# Patient Record
Sex: Male | Born: 1990 | Race: White | Hispanic: No | Marital: Married | Smoking: Never smoker
Health system: Southern US, Community
[De-identification: ages and names within clinical notes are randomized; demographics above are authoritative.]

---

## 2010-08-01 HISTORY — PX: TONSILLECTOMY: SUR1361

## 2016-03-21 DIAGNOSIS — Z20828 Contact with and (suspected) exposure to other viral communicable diseases: Secondary | ICD-10-CM | POA: Diagnosis not present

## 2016-05-20 ENCOUNTER — Other Ambulatory Visit: Payer: Self-pay | Admitting: Family Medicine

## 2016-05-20 DIAGNOSIS — Z0000 Encounter for general adult medical examination without abnormal findings: Secondary | ICD-10-CM | POA: Diagnosis not present

## 2016-05-20 DIAGNOSIS — Z13220 Encounter for screening for lipoid disorders: Secondary | ICD-10-CM | POA: Diagnosis not present

## 2016-05-20 DIAGNOSIS — N5089 Other specified disorders of the male genital organs: Secondary | ICD-10-CM

## 2016-05-30 ENCOUNTER — Ambulatory Visit
Admission: RE | Admit: 2016-05-30 | Discharge: 2016-05-30 | Disposition: A | Discharge status: Home / Self Care | Payer: 59 | Source: Ambulatory Visit | Attending: Family Medicine | Admitting: Family Medicine

## 2016-05-30 DIAGNOSIS — N5089 Other specified disorders of the male genital organs: Secondary | ICD-10-CM

## 2016-07-08 ENCOUNTER — Other Ambulatory Visit: Payer: 59

## 2016-11-23 ENCOUNTER — Other Ambulatory Visit: Payer: Self-pay | Admitting: Family Medicine

## 2016-11-23 DIAGNOSIS — N5089 Other specified disorders of the male genital organs: Secondary | ICD-10-CM

## 2016-11-29 ENCOUNTER — Ambulatory Visit
Admission: RE | Admit: 2016-11-29 | Discharge: 2016-11-29 | Disposition: A | Discharge status: Home / Self Care | Payer: 59 | Source: Ambulatory Visit | Attending: Family Medicine | Admitting: Family Medicine

## 2016-11-29 DIAGNOSIS — N503 Cyst of epididymis: Secondary | ICD-10-CM | POA: Diagnosis not present

## 2016-11-29 DIAGNOSIS — N5089 Other specified disorders of the male genital organs: Secondary | ICD-10-CM

## 2016-11-30 DIAGNOSIS — Z23 Encounter for immunization: Secondary | ICD-10-CM | POA: Diagnosis not present

## 2017-05-23 DIAGNOSIS — R7301 Impaired fasting glucose: Secondary | ICD-10-CM | POA: Diagnosis not present

## 2017-05-23 DIAGNOSIS — Z13220 Encounter for screening for lipoid disorders: Secondary | ICD-10-CM | POA: Diagnosis not present

## 2017-05-23 DIAGNOSIS — Z0000 Encounter for general adult medical examination without abnormal findings: Secondary | ICD-10-CM | POA: Diagnosis not present

## 2017-11-28 DIAGNOSIS — J208 Acute bronchitis due to other specified organisms: Secondary | ICD-10-CM | POA: Diagnosis not present

## 2017-12-13 DIAGNOSIS — Z23 Encounter for immunization: Secondary | ICD-10-CM | POA: Diagnosis not present

## 2018-03-19 DIAGNOSIS — R0681 Apnea, not elsewhere classified: Secondary | ICD-10-CM | POA: Diagnosis not present

## 2018-12-06 IMAGING — US US SCROTUM W/ DOPPLER COMPLETE
1 series · 14 of 25 positions shown · non-contrast
Comparison: None.

CLINICAL DATA: Right-sided testicular lump

EXAM:
SCROTAL ULTRASOUND
DOPPLER ULTRASOUND OF THE TESTICLES
TECHNIQUE: Complete ultrasound examination of the testicles, epididymis, and
other scrotal structures was performed. Color and spectral Doppler
ultrasound were also utilized to evaluate blood flow to the
testicles.

[Series 1: us scrotum w/ doppler complete · 0.05mm/px · 14 of 53 slices shown]
[im 1/53]
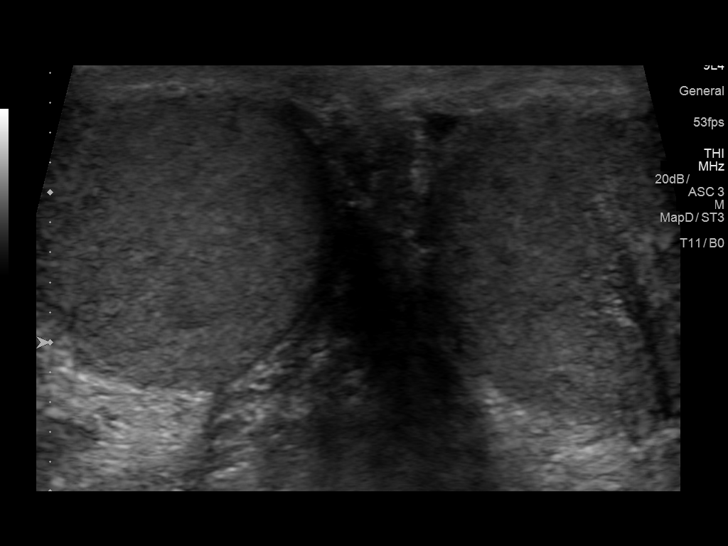
[im 5/53]
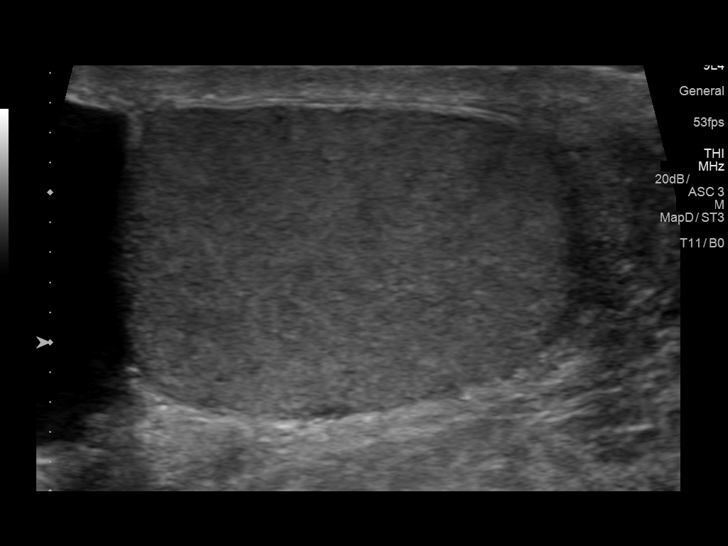
[im 9/53]
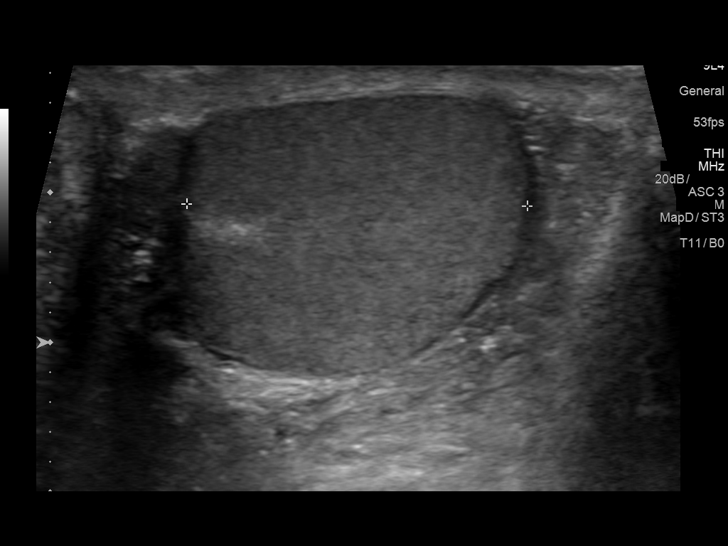
[im 14/53]
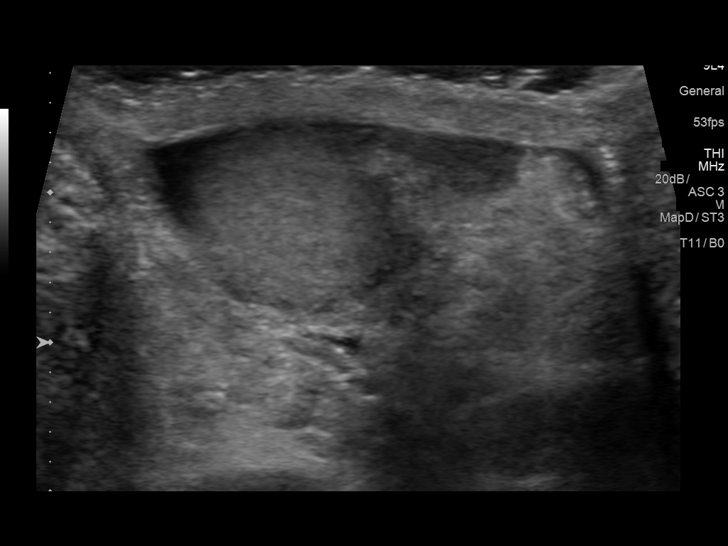
[im 18/53]
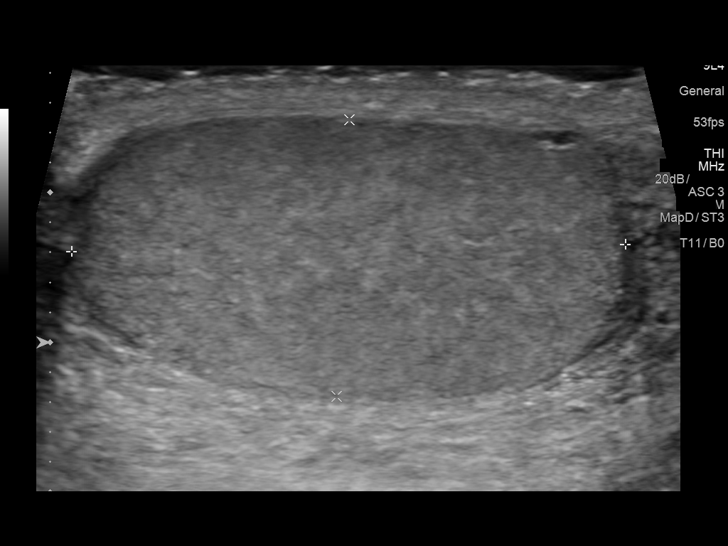
[im 20/53]
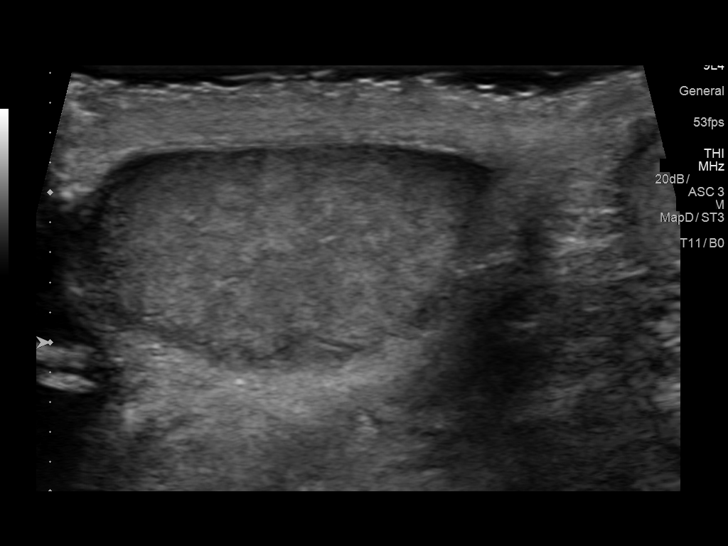
[im 24/53]
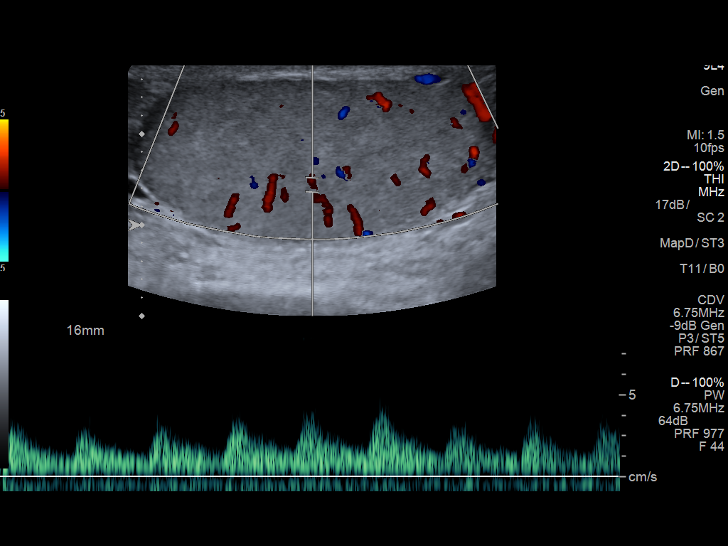
[im 29/53]
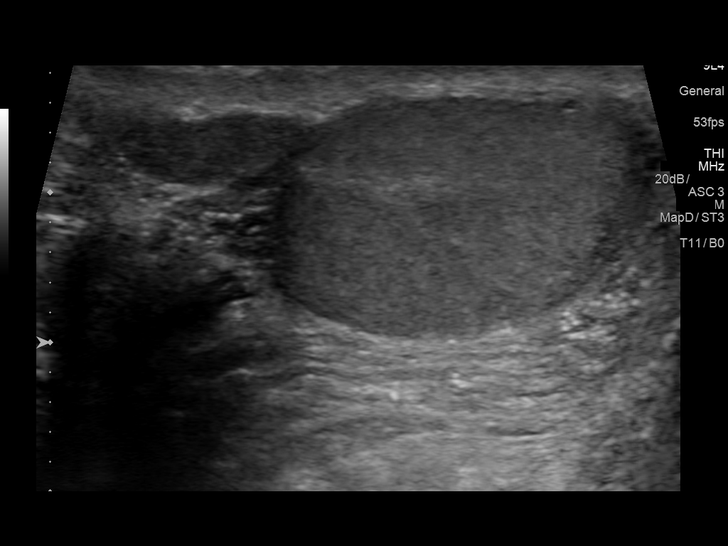
[im 33/53]
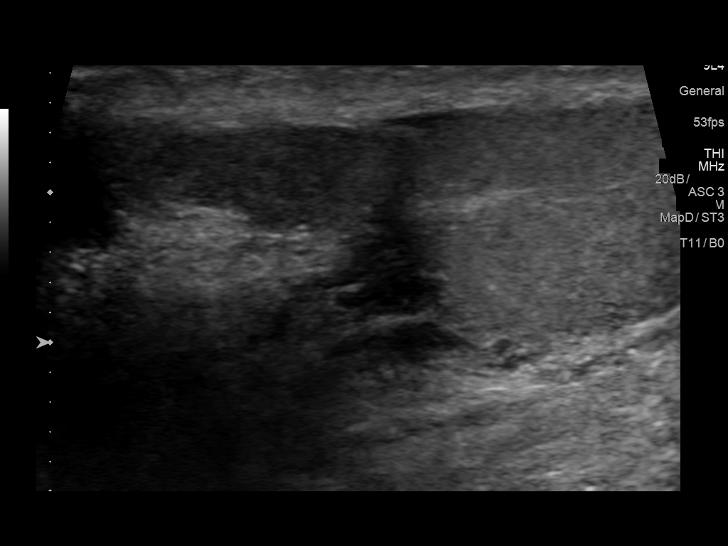
[im 35/53]
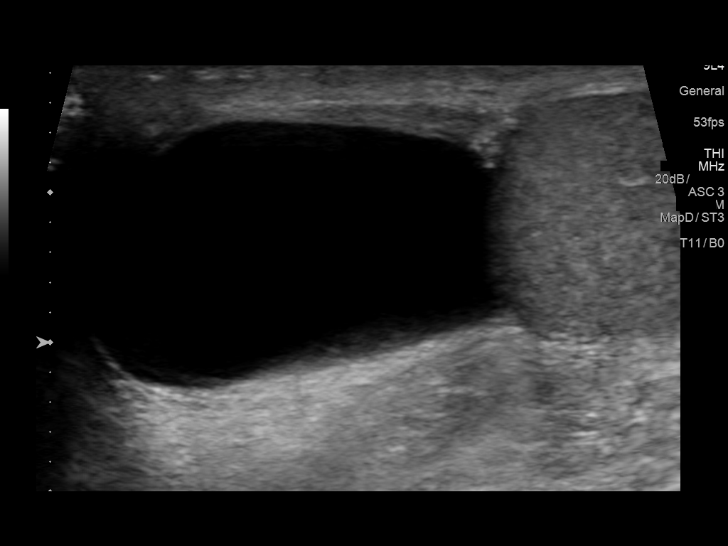
[im 40/53]
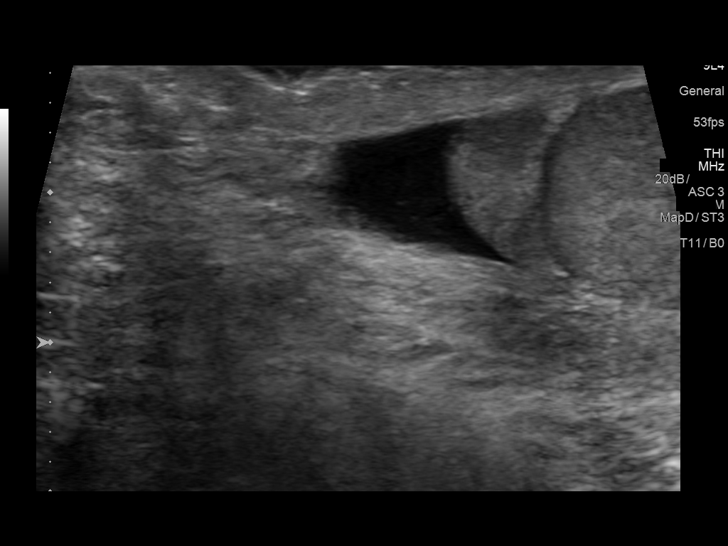
[im 44/53]
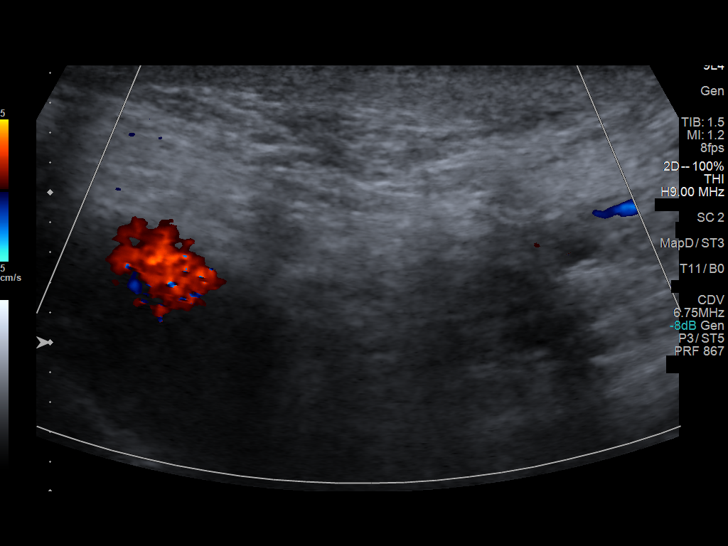
[im 48/53]
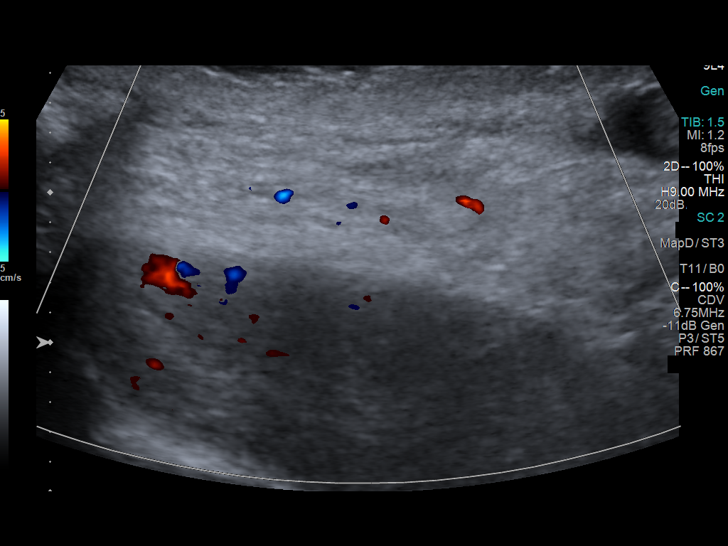
[im 53/53]
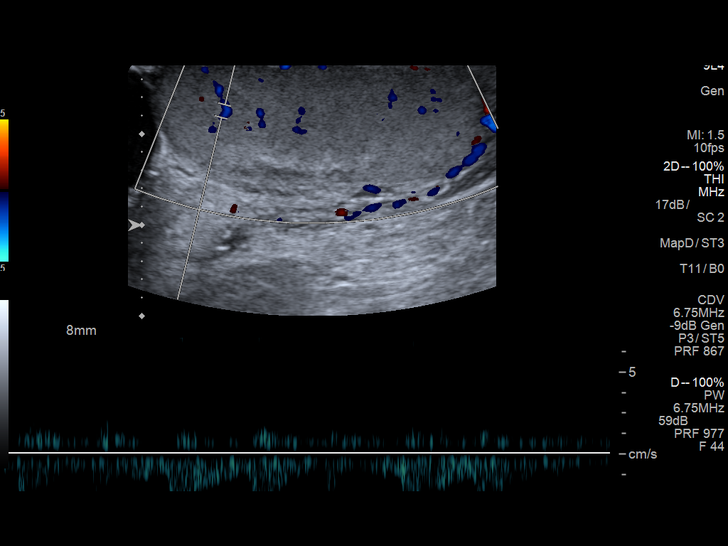

[14 of 25 positions shown; findings below may reference images not displayed]

FINDINGS: Right testicle

Measurements: 3.1 x 2.1 x 2.3 cm.. No mass or microlithiasis
visualized.

Left testicle

Measurements: 3.7 x 1.8 x 2.6 cm.. No mass or microlithiasis
visualized.

Right epididymis: There is a 2.5 cm cyst with noted within the
epididymis on the right.

Left epididymis:  Normal in size and appearance.

Hydrocele:  Small bilateral hydroceles are noted.

Varicocele:  None visualized.

Pulsed Doppler interrogation of both testes demonstrates normal low
resistance arterial and venous waveforms bilaterally.
IMPRESSION: Right epididymal cyst. This would correspond with the patient's
given clinical history. No other focal abnormality is noted.

## 2019-05-21 MED FILL — predniSONE 5 MG (21) TBPK: 5 | 6 days supply | Qty: 21 | Fill #0

## 2019-05-21 MED FILL — METHOCARBAMOL 500 MG TABS: 500 | 10 days supply | Qty: 30 | Fill #0

## 2019-06-11 ENCOUNTER — Other Ambulatory Visit: Payer: Self-pay

## 2019-06-11 ENCOUNTER — Ambulatory Visit (INDEPENDENT_AMBULATORY_CARE_PROVIDER_SITE_OTHER): Payer: No Typology Code available for payment source | Admitting: Family Medicine

## 2019-06-11 ENCOUNTER — Encounter (INDEPENDENT_AMBULATORY_CARE_PROVIDER_SITE_OTHER): Payer: Self-pay | Admitting: Family Medicine

## 2019-06-11 VITALS — BP 119/77 | HR 58 | Temp 97.9°F | Ht 71.0 in | Wt 316.0 lb

## 2019-06-11 DIAGNOSIS — R5383 Other fatigue: Secondary | ICD-10-CM

## 2019-06-11 DIAGNOSIS — R0602 Shortness of breath: Secondary | ICD-10-CM | POA: Diagnosis not present

## 2019-06-11 DIAGNOSIS — E65 Localized adiposity: Secondary | ICD-10-CM | POA: Diagnosis not present

## 2019-06-11 DIAGNOSIS — Z1331 Encounter for screening for depression: Secondary | ICD-10-CM

## 2019-06-11 DIAGNOSIS — R948 Abnormal results of function studies of other organs and systems: Secondary | ICD-10-CM | POA: Diagnosis not present

## 2019-06-11 DIAGNOSIS — Z9189 Other specified personal risk factors, not elsewhere classified: Secondary | ICD-10-CM

## 2019-06-11 DIAGNOSIS — Z0289 Encounter for other administrative examinations: Secondary | ICD-10-CM

## 2019-06-11 DIAGNOSIS — R739 Hyperglycemia, unspecified: Secondary | ICD-10-CM

## 2019-06-11 DIAGNOSIS — R0683 Snoring: Secondary | ICD-10-CM

## 2019-06-11 DIAGNOSIS — Z6841 Body Mass Index (BMI) 40.0 and over, adult: Secondary | ICD-10-CM

## 2019-06-11 NOTE — Progress Notes (Signed)
Chief Complaint:   OBESITY Shawn Hawkins (MR# [id number]) is a 29 y.o. male who presents for evaluation and treatment of obesity and related comorbidities. Current BMI is Body mass index is 44.07 kg/m. Shawn Hawkins has been struggling with his weight for many years and has been unsuccessful in either losing weight, maintaining weight loss, or reaching his healthy weight goal.  Shawn Hawkins is currently in the action stage of change and ready to dedicate time achieving and maintaining a healthier weight. Shawn Hawkins is interested in becoming our patient and working on intensive lifestyle modifications including (but not limited to) diet and exercise for weight loss.  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  He is married and lives with his wife, Shawn Hawkins.  He exercises regularly.  He says he eats when he is bored.  His desired weight is 201 pounds.  He recently used the keto diet and lost 25 pounds, but gained it back.  He likes paleo.  He is vaccinated against COVID.  Shawn Hawkins's habits were reviewed today and are as follows: His family eats meals together, he thinks his family will eat healthier with him, his desired weight loss is 100 pounds, he has been heavy most of his life, he started gaining weight when he was 48-60 years old, his heaviest weight ever was 320 pounds, he craves pizza and hamburgers, he is frequently drinking liquids with calories, he sometimes makes poor food choices, he frequently eats larger portions than normal and he struggles with emotional eating.  Depression Screen Shawn Hawkins's Food and Mood (modified PHQ-9) score was 5.  Depression screen PHQ 2/9 06/11/2019  Decreased Interest 1  Down, Depressed, Hopeless 1  PHQ - 2 Score 2  Altered sleeping 1  Tired, decreased energy 1  Change in appetite 1  Feeling bad or failure about yourself  0  Trouble concentrating 0  Moving slowly or fidgety/restless 0  Suicidal thoughts 0  PHQ-9 Score 5  Difficult doing  work/chores Not difficult at all   Subjective:   1. Other fatigue Shawn Hawkins admits to daytime somnolence and denies waking up still tired. Patent has a history of symptoms of daytime fatigue and snoring. Shawn Hawkins generally gets 7 or 8 hours of sleep per night, and states that he has generally restful sleep. Snoring is present. Apneic episodes are present. Epworth Sleepiness Score is 4.  2. Shortness of breath on exertion Shawn Hawkins Kitchen notes increasing shortness of breath with exercising and seems to be worsening over time with weight gain. He notes getting out of breath sooner with activity than he used to. This has gotten worse recently. [name] denies shortness of breath at rest or orthopnea.  3. Hyperglycemia Shawn Hawkins has a history of some elevated blood glucose readings without a diagnosis of diabetes. He denies polyphagia.  4. Visceral obesity Bioimpedence results equal a rating of 22 (goal 13).  5. Abnormal metabolism, lower than calculated Shawn Hawkins has a lower than calculated metabolism.  6. Snores Shawn Hawkins snores and has apneic events.  7. Depression screening Shawn Hawkins was screened for depression as part of his new patient workup today.  PHQ-9 is 5.  8. At risk for obstructive sleep apnea Shawn Hawkins is at increased risk for sleep apnea due to obesity. The patient endorses the following symptoms: snoring and apneic events. Epworth score: 4.   Assessment/Plan:   1. Other fatigue Shawn Hawkins does feel that his weight is causing his energy to be lower than it should be. Fatigue may be related to  obesity, depression or many other causes. Labs will be ordered, and in the meanwhile, [name] will focus on self care including making healthy food choices, increasing physical activity and focusing on stress reduction.  Orders - EKG 12-Lead - Comprehensive metabolic panel - CBC with Differential/Platelet - Lipid Panel With LDL/HDL Ratio - VITAMIN D 25 Hydroxy (Vit-D Deficiency, Fractures) -  T3 - Anemia panel  2. Shortness of breath on exertion Shawn Hawkins does feel that he gets out of breath more easily that he used to when he exercises. Shawn Hawkins's shortness of breath appears to be obesity related and exercise induced. He has agreed to work on weight loss and gradually increase exercise to treat his exercise induced shortness of breath. Will continue to monitor closely.  3. Hyperglycemia Fasting labs will be obtained and results with be discussed with Shawn Hawkins in 2 weeks at his follow up visit. In the meanwhile Shawn Hawkins was started on a lower simple carbohydrate diet and will work on weight loss efforts. - Hemoglobin A1c - Insulin, random  4. Visceral obesity Will continue to monitor.  5. Abnormal metabolism, lower than calculated Will increase protein and strength training.  6. Snores Will monitor.  7. Depression screening Depression screening was negative today.  8. At risk for obstructive sleep apnea Shawn Hawkins was given approximately 15 minutes of coronary artery disease prevention counseling today. He is 29 y.o. male and has risk factors for obstructive sleep apnea including obesity. We discussed intensive lifestyle modifications today with an emphasis on specific weight loss instructions and strategies.  Repetitive spaced learning was employed today to elicit superior memory formation and behavioral change.  9. Class 3 severe obesity with serious comorbidity and body mass index (BMI) of 40.0 to 44.9 in adult, unspecified obesity type [facility]) Shawn Hawkins is currently in the action stage of change and his goal is to continue with weight loss efforts. I recommend Shawn Hawkins begin the structured treatment plan as follows:  He has agreed to keeping a food journal and adhering to recommended goals of 1500-1800 calories and 100+ grams of protein - flexitarian.  Exercise goals: No exercise has been prescribed at this time.   Behavioral modification strategies: increasing lean  protein intake, decreasing simple carbohydrates, increasing vegetables, increasing water intake and decreasing liquid calories.  He was informed of the importance of frequent follow-up visits to maximize his success with intensive lifestyle modifications for his multiple health conditions. He was informed we would discuss his lab results at his next visit unless there is a critical issue that needs to be addressed sooner. Shawn Hawkins agreed to keep his next visit at the agreed upon time to discuss these results.  Objective:   Blood pressure 119/77, pulse (!) 58, temperature 97.9 F (36.6 C), temperature source Oral, height 5\' 11"  (1.803 m), weight (!) 316 lb (143.3 kg), SpO2 97 %. Body mass index is 44.07 kg/m.  EKG: Normal sinus rhythm, rate 63 bpm.  Indirect Calorimeter completed today shows a VO2 of 331 and a REE of 2305.  His calculated basal metabolic rate is thus his basal metabolic rate is worse than expected.  General: Cooperative, alert, well developed, in no acute distress. HEENT: Conjunctivae and lids unremarkable. Cardiovascular: Regular rhythm.  Lungs: Normal work of breathing. Neurologic: No focal deficits.   Attestation Statements:   This is the patient's first visit at [facility]. The patient's NEW PATIENT PACKET was reviewed at length. Included in the packet: current and past health history, medications, allergies, ROS, gynecologic history (women  only), surgical history, family history, social history, weight history, weight loss surgery history (for those that have had weight loss surgery), nutritional evaluation, mood and food questionnaire, PHQ9, Epworth questionnaire, sleep habits questionnaire, patient life and health improvement goals questionnaire. These will all be scanned into the patient's chart under media.   During the visit, I independently reviewed the patient's EKG, bioimpedance scale results, and indirect calorimeter results. I used this  information to tailor a meal plan for the patient that will help him to lose weight and will improve his obesity-related conditions going forward. I performed a medically necessary appropriate examination and/or evaluation. I discussed the assessment and treatment plan with the patient. The patient was provided an opportunity to ask questions and all were answered. The patient agreed with the plan and demonstrated an understanding of the instructions. Labs were ordered at this visit and will be reviewed at the next visit unless more critical results need to be addressed immediately. Clinical information was updated and documented in the EMR.   I, Insurance claims handler, CMA, am acting as Energy manager for [employer], DO.  I have reviewed the above documentation for accuracy and completeness, and I agree with the above. Helane Rima, DO

## 2019-06-12 LAB — CBC WITH DIFFERENTIAL/PLATELET
Basophils Absolute: 0.1 10*3/uL (ref 0.0–0.2)
Basos: 1 %
EOS (ABSOLUTE): 0.1 10*3/uL (ref 0.0–0.4)
Eos: 2 %
Hematocrit: 48.5 % (ref 37.5–51.0)
Hemoglobin: 16 g/dL (ref 13.0–17.7)
Immature Grans (Abs): 0 10*3/uL (ref 0.0–0.1)
Immature Granulocytes: 0 %
Lymphocytes Absolute: 1.6 10*3/uL (ref 0.7–3.1)
Lymphs: 36 %
MCH: 31.3 pg (ref 26.6–33.0)
MCHC: 33 g/dL (ref 31.5–35.7)
MCV: 95 fL (ref 79–97)
Monocytes Absolute: 0.6 10*3/uL (ref 0.1–0.9)
Monocytes: 12 %
Neutrophils Absolute: 2.1 10*3/uL (ref 1.4–7.0)
Neutrophils: 49 %
Platelets: 252 10*3/uL (ref 150–450)
RBC: 5.12 x10E6/uL (ref 4.14–5.80)
RDW: 12.4 % (ref 11.6–15.4)
WBC: 4.5 10*3/uL (ref 3.4–10.8)

## 2019-06-12 LAB — LIPID PANEL WITH LDL/HDL RATIO
Cholesterol, Total: 179 mg/dL (ref 100–199)
HDL: 39 mg/dL — ABNORMAL LOW (ref >39)
LDL Chol Calc (NIH): 114 mg/dL — ABNORMAL HIGH (ref 0–99)
LDL/HDL Ratio: 2.9 ratio (ref 0.0–3.6)
Triglycerides: 147 mg/dL (ref 0–149)
VLDL Cholesterol Cal: 26 mg/dL (ref 5–40)

## 2019-06-12 LAB — HEMOGLOBIN A1C
Est. average glucose Bld gHb Est-mCnc: 117 mg/dL
Hgb A1c MFr Bld: 5.7 % — ABNORMAL HIGH (ref 4.8–5.6)

## 2019-06-12 LAB — VITAMIN B12: Vitamin B-12: 467 pg/mL (ref 232–1245)

## 2019-06-12 LAB — VITAMIN D 25 HYDROXY (VIT D DEFICIENCY, FRACTURES): Vit D, 25-Hydroxy: 41.9 ng/mL (ref 30.0–100.0)

## 2019-06-12 LAB — T3: T3, Total: 153 ng/dL (ref 71–180)

## 2019-06-12 LAB — COMPREHENSIVE METABOLIC PANEL
ALT: 38 IU/L (ref 0–44)
AST: 26 IU/L (ref 0–40)
Albumin/Globulin Ratio: 1.8 (ref 1.2–2.2)
Albumin: 4.4 g/dL (ref 4.1–5.2)
Alkaline Phosphatase: 89 IU/L (ref 39–117)
BUN/Creatinine Ratio: 15 (ref 9–20)
BUN: 12 mg/dL (ref 6–20)
Bilirubin Total: 0.7 mg/dL (ref 0.0–1.2)
CO2: 23 mmol/L (ref 20–29)
Calcium: 9.4 mg/dL (ref 8.7–10.2)
Chloride: 104 mmol/L (ref 96–106)
Creatinine, Ser: 0.81 mg/dL (ref 0.76–1.27)
GFR calc Af Amer: 139 mL/min/{1.73_m2} (ref >59)
GFR calc non Af Amer: 120 mL/min/{1.73_m2} (ref >59)
Globulin, Total: 2.5 g/dL (ref 1.5–4.5)
Glucose: 95 mg/dL (ref 65–99)
Potassium: 4.5 mmol/L (ref 3.5–5.2)
Sodium: 140 mmol/L (ref 134–144)
Total Protein: 6.9 g/dL (ref 6.0–8.5)

## 2019-06-12 LAB — IRON AND TIBC
Iron Saturation: 24 % (ref 15–55)
Iron: 74 ug/dL (ref 38–169)
Total Iron Binding Capacity: 309 ug/dL (ref 250–450)
UIBC: 235 ug/dL (ref 111–343)

## 2019-06-12 LAB — INSULIN, RANDOM: INSULIN: 8.1 u[IU]/mL (ref 2.6–24.9)

## 2019-06-12 LAB — FERRITIN: Ferritin: 99 ng/mL (ref 30–400)

## 2019-06-12 LAB — FOLATE: Folate: 17.3 ng/mL

## 2019-06-25 ENCOUNTER — Ambulatory Visit (INDEPENDENT_AMBULATORY_CARE_PROVIDER_SITE_OTHER): Payer: No Typology Code available for payment source | Admitting: Family Medicine

## 2019-06-25 ENCOUNTER — Other Ambulatory Visit: Payer: Self-pay

## 2019-06-25 ENCOUNTER — Encounter (INDEPENDENT_AMBULATORY_CARE_PROVIDER_SITE_OTHER): Payer: Self-pay | Admitting: Family Medicine

## 2019-06-25 VITALS — BP 108/71 | HR 60 | Temp 97.8°F | Ht 71.0 in | Wt 313.0 lb

## 2019-06-25 DIAGNOSIS — E785 Hyperlipidemia, unspecified: Secondary | ICD-10-CM

## 2019-06-25 DIAGNOSIS — R7303 Prediabetes: Secondary | ICD-10-CM

## 2019-06-25 DIAGNOSIS — E65 Localized adiposity: Secondary | ICD-10-CM

## 2019-06-25 DIAGNOSIS — Z9189 Other specified personal risk factors, not elsewhere classified: Secondary | ICD-10-CM

## 2019-06-25 DIAGNOSIS — Z6841 Body Mass Index (BMI) 40.0 and over, adult: Secondary | ICD-10-CM

## 2019-06-25 DIAGNOSIS — R948 Abnormal results of function studies of other organs and systems: Secondary | ICD-10-CM | POA: Diagnosis not present

## 2019-06-25 DIAGNOSIS — R0683 Snoring: Secondary | ICD-10-CM

## 2019-06-25 NOTE — Progress Notes (Signed)
Chief Complaint:   OBESITY Shawn Hawkins is here to discuss his progress with his obesity treatment plan along with follow-up of his obesity related diagnoses. Shawn Hawkins is on keeping a food journal and adhering to recommended goals of 1500-1600 calories and 100+ grams of protein and states he is following his eating plan approximately 100% of the time. Shawn Hawkins states he is doing cardio for 30 minutes 2 times per week.  Today's visit was #: 2 Starting weight: 316 lbs Starting date: 06/11/2019 Today's weight: 313 lbs Today's date: 06/25/2019 Total lbs lost to date: 3 lbs Total lbs lost since last in-office visit: 3 lbs  Interim History: Shawn Hawkins says he had furniture week last week and 2 graduation parties.  He reports making good decisions but still had some extra alcohol and calorie-dense foods.  Subjective:   1. Prediabetes Shawn Hawkins has a diagnosis of prediabetes based on his elevated HgA1c and was informed this puts him at greater risk of developing diabetes. He continues to work on diet and exercise to decrease his risk of diabetes. He denies nausea or hypoglycemia.  Lab Results  Component Value Date   HGBA1C 5.7 (H) 06/11/2019   Lab Results  Component Value Date   INSULIN 8.1 06/11/2019   2. Dyslipidemia Shawn Hawkins has dyslipidemia and has been trying to improve his cholesterol levels with intensive lifestyle modification including a low saturated fat diet, exercise and weight loss. He denies any chest pain, claudication or myalgias.  His HDL is low and his LDL is elevated.  Lab Results  Component Value Date   ALT 38 06/11/2019   AST 26 06/11/2019   ALKPHOS 89 06/11/2019   BILITOT 0.7 06/11/2019   Lab Results  Component Value Date   CHOL 179 06/11/2019   HDL 39 (L) 06/11/2019   LDLCALC 114 (H) 06/11/2019   TRIG 147 06/11/2019   3. Abnormal metabolism, lower than calculated Shawn Hawkins has a lower than calculated metabolism.  4. Snoring Shawn Hawkins has a sleep study scheduled  with Dr. Percell Miller at [facility] next week.  5. Visceral obesity Bioimpedence results show a rating of 22 (goal 13).  6. At risk for heart disease Moss is at a higher than average risk for cardiovascular disease due to obesity.   Assessment/Plan:   1. Prediabetes Vaiden will continue to work on weight loss, exercise, and decreasing simple carbohydrates to help decrease the risk of diabetes.  Discussed metformin and GLP-1 RA options.  Patient would like to wait at this time.  2. Dyslipidemia Cardiovascular risk and specific lipid/LDL goals reviewed.  We discussed several lifestyle modifications today and Marcus will continue to work on diet, exercise and weight loss efforts. Orders and follow up as documented in patient record.   Counseling Intensive lifestyle modifications are the first line treatment for this issue. . Dietary changes: Increase soluble fiber. Decrease simple carbohydrates. . Exercise changes: Moderate to vigorous-intensity aerobic activity 150 minutes per week if tolerated. . Lipid-lowering medications: see documented in medical record.  3. Abnormal metabolism, lower than calculated Will focus on increasing protein and decreasing carbohydrates.  Discussed strength training along with aerobic exercise.    4. Snoring Will follow along.  5. Visceral obesity Will focus on insulin resistance/prediabetes.  Archie prefers a plant-based, paleo diet.  Carbohydrate goal is less than 100.  6. At risk for heart disease Shawn Hawkins was given approximately 15 minutes of coronary artery disease prevention counseling today. He is 29 y.o. male and has risk factors for heart disease including obesity. We  discussed intensive lifestyle modifications today with an emphasis on specific weight loss instructions and strategies.   Repetitive spaced learning was employed today to elicit superior memory formation and behavioral change.  7. Class 3 severe obesity with serious  comorbidity and body mass index (BMI) of 40.0 to 44.9 in adult, unspecified obesity type [facility]) Shawn Hawkins is currently in the action stage of change. As such, his goal is to continue with weight loss efforts. He has agreed to keeping a food journal and adhering to recommended goals of 1500-1800 calories and 100+ grams of protein and keeping carbs at less than 100 daily.   Exercise goals: For substantial health benefits, adults should do at least 150 minutes (2 hours and 30 minutes) a week of moderate-intensity, or 75 minutes (1 hour and 15 minutes) a week of vigorous-intensity aerobic physical activity, or an equivalent combination of moderate- and vigorous-intensity aerobic activity. Aerobic activity should be performed in episodes of at least 10 minutes, and preferably, it should be spread throughout the week.  Behavioral modification strategies: increasing lean protein intake and increasing water intake.  Shawn Hawkins has agreed to follow-up with our clinic in 3 weeks. He was informed of the importance of frequent follow-up visits to maximize his success with intensive lifestyle modifications for his multiple health conditions.   Objective:   Blood pressure 108/71, pulse 60, temperature 97.8 F (36.6 C), temperature source Oral, height 5\' 11"  (1.803 m), weight (!) 313 lb (142 kg), SpO2 98 %. Body mass index is 43.65 kg/m.  General: Cooperative, alert, well developed, in no acute distress. HEENT: Conjunctivae and lids unremarkable. Cardiovascular: Regular rhythm.  Lungs: Normal work of breathing. Neurologic: No focal deficits.   Lab Results  Component Value Date   CREATININE 0.81 06/11/2019   BUN 12 06/11/2019   NA 140 06/11/2019   K 4.5 06/11/2019   CL 104 06/11/2019   CO2 23 06/11/2019   Lab Results  Component Value Date   ALT 38 06/11/2019   AST 26 06/11/2019   ALKPHOS 89 06/11/2019   BILITOT 0.7 06/11/2019   Lab Results  Component Value Date   HGBA1C 5.7 (H) 06/11/2019   Lab  Results  Component Value Date   INSULIN 8.1 06/11/2019   Lab Results  Component Value Date   CHOL 179 06/11/2019   HDL 39 (L) 06/11/2019   LDLCALC 114 (H) 06/11/2019   TRIG 147 06/11/2019   Lab Results  Component Value Date   WBC 4.5 06/11/2019   HGB 16.0 06/11/2019   HCT 48.5 06/11/2019   MCV 95 06/11/2019   PLT 252 06/11/2019   Lab Results  Component Value Date   IRON 74 06/11/2019   TIBC 309 06/11/2019   FERRITIN 99 06/11/2019   Attestation Statements:   Reviewed by clinician on day of visit: allergies, medications, problem list, medical history, surgical history, family history, social history, and previous encounter notes.  I, 08/11/2019, CMA, am acting as Insurance claims handler for Energy manager, DO.  I have reviewed the above documentation for accuracy and completeness, and I agree with the above. [employer], DO

## 2019-07-16 ENCOUNTER — Other Ambulatory Visit (HOSPITAL_BASED_OUTPATIENT_CLINIC_OR_DEPARTMENT_OTHER): Payer: Self-pay

## 2019-07-16 DIAGNOSIS — R0681 Apnea, not elsewhere classified: Secondary | ICD-10-CM

## 2019-07-16 DIAGNOSIS — R0683 Snoring: Secondary | ICD-10-CM

## 2019-07-24 ENCOUNTER — Encounter (INDEPENDENT_AMBULATORY_CARE_PROVIDER_SITE_OTHER): Payer: Self-pay

## 2019-07-24 ENCOUNTER — Ambulatory Visit (INDEPENDENT_AMBULATORY_CARE_PROVIDER_SITE_OTHER): Payer: No Typology Code available for payment source | Admitting: Family Medicine

## 2019-07-31 ENCOUNTER — Ambulatory Visit (INDEPENDENT_AMBULATORY_CARE_PROVIDER_SITE_OTHER): Payer: No Typology Code available for payment source | Admitting: Family Medicine

## 2019-07-31 ENCOUNTER — Other Ambulatory Visit: Payer: Self-pay

## 2019-07-31 ENCOUNTER — Encounter (INDEPENDENT_AMBULATORY_CARE_PROVIDER_SITE_OTHER): Payer: Self-pay | Admitting: Family Medicine

## 2019-07-31 VITALS — BP 117/82 | HR 62 | Temp 97.8°F | Ht 71.0 in | Wt 307.0 lb

## 2019-07-31 DIAGNOSIS — Z9189 Other specified personal risk factors, not elsewhere classified: Secondary | ICD-10-CM

## 2019-07-31 DIAGNOSIS — R7303 Prediabetes: Secondary | ICD-10-CM | POA: Diagnosis not present

## 2019-07-31 DIAGNOSIS — E785 Hyperlipidemia, unspecified: Secondary | ICD-10-CM | POA: Diagnosis not present

## 2019-07-31 DIAGNOSIS — E65 Localized adiposity: Secondary | ICD-10-CM

## 2019-07-31 DIAGNOSIS — Z6841 Body Mass Index (BMI) 40.0 and over, adult: Secondary | ICD-10-CM

## 2019-07-31 NOTE — Progress Notes (Signed)
Chief Complaint:   OBESITY Shawn Hawkins is here to discuss his progress with his obesity treatment plan along with follow-up of his obesity related diagnoses. Shawn Hawkins is on keeping a food journal and adhering to recommended goals of 1500-1800 calories and 100+ grams of protein and states he is following his eating plan approximately 90% of the time. Shawn Hawkins states he is doing cardio and strength training for 60 minutes 4 times per week.  Today's visit was #: 3 Starting weight: 316 lbs Starting date: 06/11/2019 Today's weight: 307 lbs Today's date: 07/31/2019 Total lbs lost to date: 9 lbs Total lbs lost since last in-office visit: 6 lbs  Interim History: Shawn Hawkins says he is still working on [employer] and clean eating.  He is focusing on increasing exercise.  He endorses having a lot more energy.  He reports that he has been sleeping better.  Subjective:   1. Prediabetes Shawn Hawkins has a diagnosis of prediabetes based on his elevated HgA1c and was informed this puts him at greater risk of developing diabetes. He continues to work on diet and exercise to decrease his risk of diabetes. He denies nausea or hypoglycemia.  Lab Results  Component Value Date   HGBA1C 5.7 (H) 06/11/2019   Lab Results  Component Value Date   INSULIN 8.1 06/11/2019   2. Visceral obesity Shawn Hawkins has been focusing on increasing his exercise level.  He is following the plan 90% of the time.  3. Dyslipidemia Shawn Hawkins has hyperlipidemia and has been trying to improve his cholesterol levels with intensive lifestyle modification including a low saturated fat diet, exercise and weight loss. He denies any chest pain, claudication or myalgias.  Lab Results  Component Value Date   ALT 38 06/11/2019   AST 26 06/11/2019   ALKPHOS 89 06/11/2019   BILITOT 0.7 06/11/2019   Lab Results  Component Value Date   CHOL 179 06/11/2019   HDL 39 (L) 06/11/2019   LDLCALC 114 (H) 06/11/2019   TRIG 147 06/11/2019    Assessment/Plan:   1. Prediabetes Shawn Hawkins will continue to work on weight loss, exercise, and decreasing simple carbohydrates to help decrease the risk of diabetes.   2. Visceral obesity Continue to focus on the current plan to decrease visceral fat.  3. Dyslipidemia Cardiovascular risk and specific lipid/LDL goals reviewed.  We discussed several lifestyle modifications today and Shawn Hawkins will continue to work on diet, exercise and weight loss efforts. Orders and follow up as documented in patient record.   Counseling Intensive lifestyle modifications are the first line treatment for this issue. . Dietary changes: Increase soluble fiber. Decrease simple carbohydrates. . Exercise changes: Moderate to vigorous-intensity aerobic activity 150 minutes per week if tolerated. . Lipid-lowering medications: see documented in medical record.  4. At risk for heart disease Shawn Hawkins was given approximately 15 minutes of coronary artery disease prevention counseling today. He is 29 y.o. male and has risk factors for heart disease including obesity. We discussed intensive lifestyle modifications today with an emphasis on specific weight loss instructions and strategies.   Repetitive spaced learning was employed today to elicit superior memory formation and behavioral change.  5. Class 3 severe obesity with serious comorbidity and body mass index (BMI) of 40.0 to 44.9 in adult, unspecified obesity type [facility]) Shawn Hawkins is currently in the action stage of change. As such, his goal is to continue with weight loss efforts. He has agreed to keeping a food journal and adhering to recommended goals of 1500-1800 calories and 100  grams of protein.   Exercise goals: For substantial health benefits, adults should do at least 150 minutes (2 hours and 30 minutes) a week of moderate-intensity, or 75 minutes (1 hour and 15 minutes) a week of vigorous-intensity aerobic physical activity, or an equivalent combination of  moderate- and vigorous-intensity aerobic activity. Aerobic activity should be performed in episodes of at least 10 minutes, and preferably, it should be spread throughout the week.  Behavioral modification strategies: increasing lean protein intake.  Shawn Hawkins has agreed to follow-up with our clinic in 4 weeks. He was informed of the importance of frequent follow-up visits to maximize his success with intensive lifestyle modifications for his multiple health conditions.   Objective:   Blood pressure 117/82, pulse 62, temperature 97.8 F (36.6 C), temperature source Oral, height 5\' 11"  (1.803 m), weight (!) 307 lb (139.3 kg), SpO2 98 %. Body mass index is 42.82 kg/m.  General: Cooperative, alert, well developed, in no acute distress. HEENT: Conjunctivae and lids unremarkable. Cardiovascular: Regular rhythm.  Lungs: Normal work of breathing. Neurologic: No focal deficits.   Lab Results  Component Value Date   CREATININE 0.81 06/11/2019   BUN 12 06/11/2019   NA 140 06/11/2019   K 4.5 06/11/2019   CL 104 06/11/2019   CO2 23 06/11/2019   Lab Results  Component Value Date   ALT 38 06/11/2019   AST 26 06/11/2019   ALKPHOS 89 06/11/2019   BILITOT 0.7 06/11/2019   Lab Results  Component Value Date   HGBA1C 5.7 (H) 06/11/2019   Lab Results  Component Value Date   INSULIN 8.1 06/11/2019   Lab Results  Component Value Date   CHOL 179 06/11/2019   HDL 39 (L) 06/11/2019   LDLCALC 114 (H) 06/11/2019   TRIG 147 06/11/2019   Lab Results  Component Value Date   WBC 4.5 06/11/2019   HGB 16.0 06/11/2019   HCT 48.5 06/11/2019   MCV 95 06/11/2019   PLT 252 06/11/2019   Lab Results  Component Value Date   IRON 74 06/11/2019   TIBC 309 06/11/2019   FERRITIN 99 06/11/2019   Attestation Statements:   Reviewed by clinician on day of visit: allergies, medications, problem list, medical history, surgical history, family history, social history, and previous encounter notes.  I,  08/11/2019, CMA, am acting as transcriptionist for Insurance claims handler, DO  I have reviewed the above documentation for accuracy and completeness, and I agree with the above. Helane Rima, DO

## 2019-09-02 ENCOUNTER — Other Ambulatory Visit: Payer: Self-pay

## 2019-09-02 ENCOUNTER — Ambulatory Visit (INDEPENDENT_AMBULATORY_CARE_PROVIDER_SITE_OTHER): Payer: No Typology Code available for payment source | Admitting: Physician Assistant

## 2019-09-02 ENCOUNTER — Encounter (INDEPENDENT_AMBULATORY_CARE_PROVIDER_SITE_OTHER): Payer: Self-pay | Admitting: Physician Assistant

## 2019-09-02 VITALS — BP 113/72 | HR 54 | Temp 97.8°F | Ht 71.0 in | Wt 307.0 lb

## 2019-09-02 DIAGNOSIS — E7849 Other hyperlipidemia: Secondary | ICD-10-CM | POA: Diagnosis not present

## 2019-09-02 DIAGNOSIS — Z6841 Body Mass Index (BMI) 40.0 and over, adult: Secondary | ICD-10-CM

## 2019-09-02 DIAGNOSIS — R7303 Prediabetes: Secondary | ICD-10-CM

## 2019-09-02 NOTE — Progress Notes (Signed)
Chief Complaint:   OBESITY Shawn Hawkins is here to discuss his progress with his obesity treatment plan along with follow-up of his obesity related diagnoses. Shawn Hawkins is keeping a food journal and adhering to recommended goals of 1500-1800 calories and 100+ grams of protein and states he is following his eating plan approximately 70% of the time. Shawn Hawkins states he is doing cardio/strengthening 60 minutes 4 times per week.  Today's visit was #: 4 Starting weight: 316 lbs Starting date: 06/11/2019 Today's weight: 307 lbs Today's date: 09/02/2019 Total lbs lost to date: 9 Total lbs lost since last in-office visit: 0  Interim History: Shawn Hawkins states that he averages 80 grams of protein daily, but hits 1800 calories. He travels for work and is sedentary a lot of the time, but is also working out often. He finds himself hungry at times, especially after working out.  Subjective:   Prediabetes. Shawn Hawkins has a diagnosis of prediabetes based on his elevated HgA1c and was informed this puts him at greater risk of developing diabetes. He continues to work on diet and exercise to decrease his risk of diabetes. He denies nausea or hypoglycemia. Shawn Hawkins is on no medication.  Lab Results  Component Value Date   HGBA1C 5.7 (H) 06/11/2019   Lab Results  Component Value Date   INSULIN 8.1 06/11/2019   Other hyperlipidemia. Shawn Hawkins has hyperlipidemia and has been trying to improve his cholesterol levels with intensive lifestyle modification including a low saturated fat diet, exercise and weight loss. He denies any chest pain, claudication or myalgias. Shawn Hawkins is on no medication. He is exercising regularly.  Lab Results  Component Value Date   ALT 38 06/11/2019   AST 26 06/11/2019   ALKPHOS 89 06/11/2019   BILITOT 0.7 06/11/2019   Lab Results  Component Value Date   CHOL 179 06/11/2019   HDL 39 (L) 06/11/2019   LDLCALC 114 (H) 06/11/2019   TRIG 147 06/11/2019   Assessment/Plan:     Prediabetes. Kooper will continue to work on weight loss, exercise, and decreasing simple carbohydrates to help decrease the risk of diabetes.   Other hyperlipidemia. Cardiovascular risk and specific lipid/LDL goals reviewed.  We discussed several lifestyle modifications today and Adolfo will continue to work on diet, exercise and weight loss efforts. Orders and follow up as documented in patient record.   Counseling Intensive lifestyle modifications are the first line treatment for this issue. . Dietary changes: Increase soluble fiber. Decrease simple carbohydrates. . Exercise changes: Moderate to vigorous-intensity aerobic activity 150 minutes per week if tolerated. . Lipid-lowering medications: see documented in medical record.  Class 3 severe obesity with serious comorbidity and body mass index (BMI) of 40.0 to 44.9 in adult, unspecified obesity type (HCC).  Shawn Hawkins is currently in the action stage of change. As such, his goal is to continue with weight loss efforts. He has agreed to keeping a food journal and adhering to recommended goals of 1500-1800 calories and 115 grams of protein daily.   Exercise goals: For substantial health benefits, adults should do at least 150 minutes (2 hours and 30 minutes) a week of moderate-intensity, or 75 minutes (1 hour and 15 minutes) a week of vigorous-intensity aerobic physical activity, or an equivalent combination of moderate- and vigorous-intensity aerobic activity. Aerobic activity should be performed in episodes of at least 10 minutes, and preferably, it should be spread throughout the week.  Behavioral modification strategies: increasing lean protein intake and meal planning and cooking strategies.  Shawn Hawkins has  agreed to follow-up with our clinic in 4 weeks. He was informed of the importance of frequent follow-up visits to maximize his success with intensive lifestyle modifications for his multiple health conditions.   Objective:    Blood pressure 113/72, pulse 54, temperature 97.8 F (36.6 C), temperature source Oral, height 5\' 11"  (1.803 m), weight (!) 307 lb (139.3 kg), SpO2 98 %. Body mass index is 42.82 kg/m.  General: Cooperative, alert, well developed, in no acute distress. HEENT: Conjunctivae and lids unremarkable. Cardiovascular: Regular rhythm.  Lungs: Normal work of breathing. Neurologic: No focal deficits.   Lab Results  Component Value Date   CREATININE 0.81 06/11/2019   BUN 12 06/11/2019   NA 140 06/11/2019   K 4.5 06/11/2019   CL 104 06/11/2019   CO2 23 06/11/2019   Lab Results  Component Value Date   ALT 38 06/11/2019   AST 26 06/11/2019   ALKPHOS 89 06/11/2019   BILITOT 0.7 06/11/2019   Lab Results  Component Value Date   HGBA1C 5.7 (H) 06/11/2019   Lab Results  Component Value Date   INSULIN 8.1 06/11/2019   No results found for: TSH Lab Results  Component Value Date   CHOL 179 06/11/2019   HDL 39 (L) 06/11/2019   LDLCALC 114 (H) 06/11/2019   TRIG 147 06/11/2019   Lab Results  Component Value Date   WBC 4.5 06/11/2019   HGB 16.0 06/11/2019   HCT 48.5 06/11/2019   MCV 95 06/11/2019   PLT 252 06/11/2019   Lab Results  Component Value Date   IRON 74 06/11/2019   TIBC 309 06/11/2019   FERRITIN 99 06/11/2019   Attestation Statements:   Reviewed by clinician on day of visit: allergies, medications, problem list, medical history, surgical history, family history, social history, and previous encounter notes.  Time spent on visit including pre-visit chart review and post-visit charting and care was 30 minutes.   I07/12/2019, am acting as transcriptionist for Marianna Payment, PA-C   I have reviewed the above documentation for accuracy and completeness, and I agree with the above. Alois Cliche, PA-C

## 2019-09-24 ENCOUNTER — Ambulatory Visit (HOSPITAL_BASED_OUTPATIENT_CLINIC_OR_DEPARTMENT_OTHER): Payer: Self-pay | Admitting: Internal Medicine

## 2019-09-24 ENCOUNTER — Other Ambulatory Visit: Payer: Self-pay

## 2019-09-30 ENCOUNTER — Other Ambulatory Visit: Payer: Self-pay

## 2019-09-30 ENCOUNTER — Ambulatory Visit (INDEPENDENT_AMBULATORY_CARE_PROVIDER_SITE_OTHER): Payer: No Typology Code available for payment source | Admitting: Physician Assistant

## 2019-09-30 ENCOUNTER — Ambulatory Visit (HOSPITAL_BASED_OUTPATIENT_CLINIC_OR_DEPARTMENT_OTHER): Payer: No Typology Code available for payment source | Attending: Internal Medicine | Admitting: Internal Medicine

## 2019-09-30 VITALS — BP 129/87 | HR 61 | Temp 97.9°F | Ht 71.0 in | Wt 306.0 lb

## 2019-09-30 VITALS — Ht 72.0 in | Wt 305.0 lb

## 2019-09-30 DIAGNOSIS — G4733 Obstructive sleep apnea (adult) (pediatric): Secondary | ICD-10-CM | POA: Diagnosis not present

## 2019-09-30 DIAGNOSIS — R0683 Snoring: Secondary | ICD-10-CM | POA: Diagnosis present

## 2019-09-30 DIAGNOSIS — G4736 Sleep related hypoventilation in conditions classified elsewhere: Secondary | ICD-10-CM | POA: Diagnosis not present

## 2019-09-30 DIAGNOSIS — E7849 Other hyperlipidemia: Secondary | ICD-10-CM

## 2019-09-30 DIAGNOSIS — Z6841 Body Mass Index (BMI) 40.0 and over, adult: Secondary | ICD-10-CM | POA: Insufficient documentation

## 2019-09-30 DIAGNOSIS — E559 Vitamin D deficiency, unspecified: Secondary | ICD-10-CM | POA: Diagnosis not present

## 2019-09-30 NOTE — Progress Notes (Signed)
Chief Complaint:   OBESITY Shawn Hawkins is here to discuss his progress with his obesity treatment plan along with follow-up of his obesity related diagnoses. Shawn Hawkins is keeping a food journal and adhering to recommended goals of 1500-1800 calories and 115 grams of protein and states he is following his eating plan approximately 40% of the time. Shawn Hawkins states he is doing cardio/strengthening 120 minutes 4-5 times per week.  Today's visit was #: 5 Starting weight: 316 lbs Starting date: 06/11/2019 Today's weight: 306 lbs Today's date: 09/30/2019 Total lbs lost to date: 10 Total lbs lost since last in-office visit: 1  Interim History: Shawn Hawkins reports that the last few weeks he did not journal consistently due to family events. He continues to be consistent with his workouts. He is having a sleep study tonight to evaluate for OSA.  Subjective:   Vitamin D deficiency. Shawn Hawkins is on OTC Vitamin D supplementation.    Ref. Range 06/11/2019 12:16  Vitamin D, 25-Hydroxy Latest Ref Range: 30.0 - 100.0 ng/mL 41.9   Other hyperlipidemia. Shawn Hawkins has hyperlipidemia and has been trying to improve his cholesterol levels with intensive lifestyle modification including a low saturated fat diet, exercise and weight loss. He denies any chest pain. Shawn Hawkins is on Omega 3. He is exercising regularly,  Lab Results  Component Value Date   ALT 38 06/11/2019   AST 26 06/11/2019   ALKPHOS 89 06/11/2019   BILITOT 0.7 06/11/2019   Lab Results  Component Value Date   CHOL 179 06/11/2019   HDL 39 (L) 06/11/2019   LDLCALC 114 (H) 06/11/2019   TRIG 147 06/11/2019   Assessment/Plan:   Vitamin D deficiency. Low Vitamin D level contributes to fatigue and are associated with obesity, breast, and colon cancer. He agrees to continue to take Vitamin D as directed and will follow-up for routine testing of Vitamin D, at least 2-3 times per year to avoid over-replacement.  Other hyperlipidemia.  Cardiovascular risk and specific lipid/LDL goals reviewed.  We discussed several lifestyle modifications today and Shawn Hawkins will continue to work on diet, exercise and weight loss efforts. Orders and follow up as documented in patient record. He will continue OTC medications as directed.   Counseling Intensive lifestyle modifications are the first line treatment for this issue. . Dietary changes: Increase soluble fiber. Decrease simple carbohydrates. . Exercise changes: Moderate to vigorous-intensity aerobic activity 150 minutes per week if tolerated. . Lipid-lowering medications: see documented in medical record.  Class 3 severe obesity with serious comorbidity and body mass index (BMI) of 40.0 to 44.9 in adult, unspecified obesity type (HCC).  Shawn Hawkins is currently in the action stage of change. As such, his goal is to continue with weight loss efforts. He has agreed to keeping a food journal and adhering to recommended goals of 1800 calories and 115 grams of protein daily.   Handout was provided on Recipes  Exercise goals: For substantial health benefits, adults should do at least 150 minutes (2 hours and 30 minutes) a week of moderate-intensity, or 75 minutes (1 hour and 15 minutes) a week of vigorous-intensity aerobic physical activity, or an equivalent combination of moderate- and vigorous-intensity aerobic activity. Aerobic activity should be performed in episodes of at least 10 minutes, and preferably, it should be spread throughout the week.  Behavioral modification strategies: meal planning and cooking strategies and keeping a strict food journal.  Shawn Hawkins has agreed to follow-up with our clinic fasting for labs in 5 weeks. He was informed of the  importance of frequent follow-up visits to maximize his success with intensive lifestyle modifications for his multiple health conditions.   Objective:   Blood pressure 129/87, pulse 61, temperature 97.9 F (36.6 C), temperature source  Oral, height 5\' 11"  (1.803 m), weight (!) 306 lb (138.8 kg), SpO2 97 %. Body mass index is 42.68 kg/m.  General: Cooperative, alert, well developed, in no acute distress. HEENT: Conjunctivae and lids unremarkable. Cardiovascular: Regular rhythm.  Lungs: Normal work of breathing. Neurologic: No focal deficits.   Lab Results  Component Value Date   CREATININE 0.81 06/11/2019   BUN 12 06/11/2019   NA 140 06/11/2019   K 4.5 06/11/2019   CL 104 06/11/2019   CO2 23 06/11/2019   Lab Results  Component Value Date   ALT 38 06/11/2019   AST 26 06/11/2019   ALKPHOS 89 06/11/2019   BILITOT 0.7 06/11/2019   Lab Results  Component Value Date   HGBA1C 5.7 (H) 06/11/2019   Lab Results  Component Value Date   INSULIN 8.1 06/11/2019   No results found for: TSH Lab Results  Component Value Date   CHOL 179 06/11/2019   HDL 39 (L) 06/11/2019   LDLCALC 114 (H) 06/11/2019   TRIG 147 06/11/2019   Lab Results  Component Value Date   WBC 4.5 06/11/2019   HGB 16.0 06/11/2019   HCT 48.5 06/11/2019   MCV 95 06/11/2019   PLT 252 06/11/2019   Lab Results  Component Value Date   IRON 74 06/11/2019   TIBC 309 06/11/2019   FERRITIN 99 06/11/2019   Attestation Statements:   Reviewed by clinician on day of visit: allergies, medications, problem list, medical history, surgical history, family history, social history, and previous encounter notes.  Time spent on visit including pre-visit chart review and post-visit charting and care was 31 minutes.   I07/12/2019, am acting as transcriptionist for Marianna Payment, PA-C   I have reviewed the above documentation for accuracy and completeness, and I agree with the above. Alois Cliche, PA-C

## 2019-10-07 NOTE — Procedures (Signed)
    NAME: Shawn Hawkins DATE OF BIRTH:  1990-05-28 MEDICAL RECORD NUMBER [id number]  LOCATION: [facility]  PHYSICIAN: Deretha Emory  DATE OF STUDY: 09/30/2019  SLEEP STUDY TYPE: Out of Center Sleep Test                REFERRING PHYSICIAN: Deretha Emory, MD  INDICATION FOR STUDY: snoring, moribid obesity with recent weight loss  EPWORTH SLEEPINESS SCORE:  4 HEIGHT: 6' (182.9 cm)  WEIGHT: (!) 305 lb (138.3 kg)    Body mass index is 41.37 kg/m.  NECK SIZE: 19 in.  MEDICATIONS Patient self administered medications include: N/A. Medications administered during study include No sleep medicine administered.Marland Kitchen  SLEEP STUDY TECHNIQUE A multi-channel overnight portable sleep study was performed. The channels recorded were: nasal and oral airflow, thoracic and abdominal respiratory movement, and oxygen saturation with a pulse oximetry. Snoring and body position were also monitored.  TECHNICIAN COMMENTS Comments added by Technician: N/A Comments added by Scorer: N/A  RECORDING SUMMARY The study was initiated at 10:58:08 PM and terminated at 7:25:02 AM. The total recorded time was 506.9 minutes. Time in bed was 506.4 minutes.  RESPIRATORY PARAMETERS The overall AHI was 9.5 per hour, with a central apnea index of 0.0 per hour. The patient was supine for 81.6%. The oxygen nadir was 80% during sleep. The patient had an O2 sat < 89% for 125 minutes in the night. Much of this was in the 87-89% range.   CARDIAC DATA Mean heart rate during sleep was 57.0 bpm.  IMPRESSIONS - Mild Obstructive Sleep apnea(OSA) - No Significant Central Sleep Apnea (CSA) - Persisting mild to moderate hypoxemia.   DIAGNOSIS - Obstructive Sleep Apnea (G47.33) - Nocturnal Hypoxemia (G47.36)  RECOMMENDATIONS - Treatment options include weight loss, OAT, or CPAP. He may have obesity hypoventilation syndrome but clinical history of recent weight loss is noted. This would be definitive  treatment of OHS if accomplished.  Deretha Emory Sleep specialist, American Board of Internal Medicine  ELECTRONICALLY SIGNED ON:  10/07/2019, 9:42 AM [facility] PH: [phone]   FX: [phone] ACCREDITED BY THE AMERICAN ACADEMY OF SLEEP MEDICINE

## 2019-11-05 ENCOUNTER — Ambulatory Visit (INDEPENDENT_AMBULATORY_CARE_PROVIDER_SITE_OTHER): Payer: No Typology Code available for payment source | Admitting: Physician Assistant

## 2019-11-06 ENCOUNTER — Ambulatory Visit (INDEPENDENT_AMBULATORY_CARE_PROVIDER_SITE_OTHER): Payer: No Typology Code available for payment source | Admitting: Physician Assistant

## 2019-11-06 ENCOUNTER — Encounter (INDEPENDENT_AMBULATORY_CARE_PROVIDER_SITE_OTHER): Payer: Self-pay | Admitting: Physician Assistant

## 2019-11-06 ENCOUNTER — Other Ambulatory Visit: Payer: Self-pay

## 2019-11-06 VITALS — BP 116/75 | HR 56 | Temp 97.3°F | Ht 71.0 in | Wt 305.0 lb

## 2019-11-06 DIAGNOSIS — Z9189 Other specified personal risk factors, not elsewhere classified: Secondary | ICD-10-CM | POA: Diagnosis not present

## 2019-11-06 DIAGNOSIS — R7303 Prediabetes: Secondary | ICD-10-CM | POA: Diagnosis not present

## 2019-11-06 DIAGNOSIS — G4733 Obstructive sleep apnea (adult) (pediatric): Secondary | ICD-10-CM

## 2019-11-06 DIAGNOSIS — Z6839 Body mass index (BMI) 39.0-39.9, adult: Secondary | ICD-10-CM

## 2019-11-06 DIAGNOSIS — E7849 Other hyperlipidemia: Secondary | ICD-10-CM | POA: Diagnosis not present

## 2019-11-06 DIAGNOSIS — E559 Vitamin D deficiency, unspecified: Secondary | ICD-10-CM

## 2019-11-06 NOTE — Progress Notes (Signed)
Chief Complaint:   OBESITY Shawn Hawkins is here to discuss his progress with his obesity treatment plan along with follow-up of his obesity related diagnoses. Shawn Hawkins is keeping a food journal and adhering to recommended goals of 1800 calories and 115 grams of protein and states he is following his eating plan approximately 50% of the time. Shawn Hawkins states he is strengthening/cardio 60 minutes 3-4 times per week.  Today's visit was #: 6 Starting weight: 316 lbs Starting date: 06/11/2019 Today's weight: 305 lbs Today's date: 11/06/2019 Total lbs lost to date: 11 Total lbs lost since last in-office visit: 1  Interim History: Shawn Hawkins has been journaling less consistently particularly because he is eating out more often. He does not journal his dinner most days.  Subjective:   OSA (obstructive sleep apnea). Shawn Hawkins was recently diagnosed with mild OSA. His neurologist recommended CPAP, which he will start in the next couple of weeks.  Prediabetes. Shawn Hawkins has a diagnosis of prediabetes based on his elevated HgA1c and was informed this puts him at greater risk of developing diabetes. He continues to work on diet and exercise to decrease his risk of diabetes. He denies nausea or hypoglycemia. Shawn Hawkins is on no medication. He is exercising regularly.  Lab Results  Component Value Date   HGBA1C 5.7 (H) 06/11/2019   Lab Results  Component Value Date   INSULIN 8.1 06/11/2019   Other hyperlipidemia. Shawn Hawkins has hyperlipidemia and has been trying to improve his cholesterol levels with intensive lifestyle modification including a low saturated fat diet, exercise and weight loss. He denies any chest pain, claudication or myalgias. Shawn Hawkins is on no medication. He is exercising regularly. Is due for labs.  Lab Results  Component Value Date   ALT 38 06/11/2019   AST 26 06/11/2019   ALKPHOS 89 06/11/2019   BILITOT 0.7 06/11/2019   Lab Results  Component Value Date   CHOL 179  06/11/2019   HDL 39 (L) 06/11/2019   LDLCALC 114 (H) 06/11/2019   TRIG 147 06/11/2019   Vitamin D deficiency. Shawn Hawkins is on Vitamin D supplementation. Last Vitamin D level was not at goal. He is due for labs.   Ref. Range 06/11/2019 12:16  Vitamin D, 25-Hydroxy Latest Ref Range: 30.0 - 100.0 ng/mL 41.9   At risk for heart disease. Shawn Hawkins is at a higher than average risk for cardiovascular disease due to obesity.   Assessment/Plan:   OSA (obstructive sleep apnea). Intensive lifestyle modifications are the first line treatment for this issue. We discussed several lifestyle modifications today and he will continue to work on diet, exercise and weight loss efforts. We will continue to monitor. Orders and follow up as documented in patient record. [name] will start CPAP as recommended and follow-up with Neurology as scheduled.  Prediabetes. Shawn Hawkins will continue to work on weight loss, exercise, and decreasing simple carbohydrates to help decrease the risk of diabetes.   Other hyperlipidemia. Cardiovascular risk and specific lipid/LDL goals reviewed.  We discussed several lifestyle modifications today and Shawn Hawkins will continue to work on diet, exercise and weight loss efforts. Orders and follow up as documented in patient record. Labs will be checked today.  Counseling Intensive lifestyle modifications are the first line treatment for this issue. . Dietary changes: Increase soluble fiber. Decrease simple carbohydrates. . Exercise changes: Moderate to vigorous-intensity aerobic activity 150 minutes per week if tolerated. . Lipid-lowering medications: see documented in medical record.  Vitamin D deficiency. Low Vitamin D level contributes to fatigue and are  associated with obesity, breast, and colon cancer. He agrees to continue to take Vitamin D as directed and VITAMIN D 25 Hydroxy (Vit-D Deficiency, Fractures) level will be checked today.   At risk for heart disease. Shawn Hawkins was given  approximately 15 minutes of coronary artery disease prevention counseling today. He is 29 y.o. male and has risk factors for heart disease including obesity. We discussed intensive lifestyle modifications today with an emphasis on specific weight loss instructions and strategies.   Repetitive spaced learning was employed today to elicit superior memory formation and behavioral change.  Class 2 severe obesity with serious comorbidity and body mass index (BMI) of 39.0 to 39.9 in adult, unspecified obesity type (HCC).  Shawn Hawkins is currently in the action stage of change. As such, his goal is to continue with weight loss efforts. He has agreed to keeping a food journal and adhering to recommended goals of 1800 calories and 115 grams of protein daily.   Exercise goals: For substantial health benefits, adults should do at least 150 minutes (2 hours and 30 minutes) a week of moderate-intensity, or 75 minutes (1 hour and 15 minutes) a week of vigorous-intensity aerobic physical activity, or an equivalent combination of moderate- and vigorous-intensity aerobic activity. Aerobic activity should be performed in episodes of at least 10 minutes, and preferably, it should be spread throughout the week.  Behavioral modification strategies: meal planning and cooking strategies and planning for success.  Shawn Hawkins has agreed to follow-up with our clinic in 4 weeks. He was informed of the importance of frequent follow-up visits to maximize his success with intensive lifestyle modifications for his multiple health conditions.   Shawn Hawkins was informed we would discuss his lab results at his next visit unless there is a critical issue that needs to be addressed sooner. Shawn Hawkins agreed to keep his next visit at the agreed upon time to discuss these results.  Objective:   Blood pressure 116/75, pulse (!) 56, temperature (!) 97.3 F (36.3 C), height 5\' 11"  (1.803 m), weight (!) 305 lb (138.3 kg), SpO2 97 %. Body mass  index is 42.54 kg/m.  General: Cooperative, alert, well developed, in no acute distress. HEENT: Conjunctivae and lids unremarkable. Cardiovascular: Regular rhythm.  Lungs: Normal work of breathing. Neurologic: No focal deficits.   Lab Results  Component Value Date   CREATININE 0.81 06/11/2019   BUN 12 06/11/2019   NA 140 06/11/2019   K 4.5 06/11/2019   CL 104 06/11/2019   CO2 23 06/11/2019   Lab Results  Component Value Date   ALT 38 06/11/2019   AST 26 06/11/2019   ALKPHOS 89 06/11/2019   BILITOT 0.7 06/11/2019   Lab Results  Component Value Date   HGBA1C 5.7 (H) 06/11/2019   Lab Results  Component Value Date   INSULIN 8.1 06/11/2019   No results found for: TSH Lab Results  Component Value Date   CHOL 179 06/11/2019   HDL 39 (L) 06/11/2019   LDLCALC 114 (H) 06/11/2019   TRIG 147 06/11/2019   Lab Results  Component Value Date   WBC 4.5 06/11/2019   HGB 16.0 06/11/2019   HCT 48.5 06/11/2019   MCV 95 06/11/2019   PLT 252 06/11/2019   Lab Results  Component Value Date   IRON 74 06/11/2019   TIBC 309 06/11/2019   FERRITIN 99 06/11/2019   Attestation Statements:   Reviewed by clinician on day of visit: allergies, medications, problem list, medical history, surgical history, family history, social history, and previous  encounter notes.  IMarianna Payment, am acting as transcriptionist for Alois Cliche, PA-C   I have reviewed the above documentation for accuracy and completeness, and I agree with the above. Alois Cliche, PA-C

## 2019-11-07 LAB — COMPREHENSIVE METABOLIC PANEL
ALT: 22 IU/L (ref 0–44)
AST: 18 IU/L (ref 0–40)
Albumin/Globulin Ratio: 1.9 (ref 1.2–2.2)
Albumin: 4.2 g/dL (ref 4.1–5.2)
Alkaline Phosphatase: 87 IU/L (ref 44–121)
BUN/Creatinine Ratio: 12 (ref 9–20)
BUN: 10 mg/dL (ref 6–20)
Bilirubin Total: 0.8 mg/dL (ref 0.0–1.2)
CO2: 26 mmol/L (ref 20–29)
Calcium: 9.2 mg/dL (ref 8.7–10.2)
Chloride: 104 mmol/L (ref 96–106)
Creatinine, Ser: 0.83 mg/dL (ref 0.76–1.27)
GFR calc Af Amer: 137 mL/min/{1.73_m2} (ref >59)
GFR calc non Af Amer: 119 mL/min/{1.73_m2} (ref >59)
Globulin, Total: 2.2 g/dL (ref 1.5–4.5)
Glucose: 94 mg/dL (ref 65–99)
Potassium: 4.8 mmol/L (ref 3.5–5.2)
Sodium: 141 mmol/L (ref 134–144)
Total Protein: 6.4 g/dL (ref 6.0–8.5)

## 2019-11-07 LAB — HEMOGLOBIN A1C
Est. average glucose Bld gHb Est-mCnc: 114 mg/dL
Hgb A1c MFr Bld: 5.6 % (ref 4.8–5.6)

## 2019-11-07 LAB — LIPID PANEL
Chol/HDL Ratio: 5.1 ratio — ABNORMAL HIGH (ref 0.0–5.0)
Cholesterol, Total: 193 mg/dL (ref 100–199)
HDL: 38 mg/dL — ABNORMAL LOW (ref >39)
LDL Chol Calc (NIH): 130 mg/dL — ABNORMAL HIGH (ref 0–99)
Triglycerides: 139 mg/dL (ref 0–149)
VLDL Cholesterol Cal: 25 mg/dL (ref 5–40)

## 2019-11-07 LAB — INSULIN, RANDOM: INSULIN: 13.7 u[IU]/mL (ref 2.6–24.9)

## 2019-11-07 LAB — VITAMIN D 25 HYDROXY (VIT D DEFICIENCY, FRACTURES): Vit D, 25-Hydroxy: 47.8 ng/mL (ref 30.0–100.0)

## 2019-12-09 ENCOUNTER — Other Ambulatory Visit: Payer: Self-pay

## 2019-12-09 ENCOUNTER — Encounter (INDEPENDENT_AMBULATORY_CARE_PROVIDER_SITE_OTHER): Payer: Self-pay | Admitting: Physician Assistant

## 2019-12-09 ENCOUNTER — Ambulatory Visit (INDEPENDENT_AMBULATORY_CARE_PROVIDER_SITE_OTHER): Payer: No Typology Code available for payment source | Admitting: Physician Assistant

## 2019-12-09 VITALS — BP 102/70 | HR 67 | Temp 97.9°F | Ht 71.0 in | Wt 300.0 lb

## 2019-12-09 DIAGNOSIS — E7849 Other hyperlipidemia: Secondary | ICD-10-CM | POA: Diagnosis not present

## 2019-12-09 DIAGNOSIS — E559 Vitamin D deficiency, unspecified: Secondary | ICD-10-CM

## 2019-12-09 DIAGNOSIS — Z9989 Dependence on other enabling machines and devices: Secondary | ICD-10-CM

## 2019-12-09 DIAGNOSIS — Z6841 Body Mass Index (BMI) 40.0 and over, adult: Secondary | ICD-10-CM

## 2019-12-09 DIAGNOSIS — R7303 Prediabetes: Secondary | ICD-10-CM | POA: Diagnosis not present

## 2019-12-09 DIAGNOSIS — G4733 Obstructive sleep apnea (adult) (pediatric): Secondary | ICD-10-CM

## 2019-12-09 NOTE — Progress Notes (Signed)
Chief Complaint:   OBESITY Berk Pilot is here to discuss his progress with his obesity treatment plan along with follow-up of his obesity related diagnoses. Charley is keeping a food journal and adhering to recommended goals of 1800 calories and 115 grams of protein and states he is following his eating plan approximately 10% of the time. Sonnie states he is doing cardio/strengthening 60 minutes 2 times per week.  Today's visit was #: 7 Starting weight: 316 lbs Starting date: 06/11/2019 Today's weight: 300 lbs Today's date: 12/09/2019 Total lbs lost to date: 16 Total lbs lost since last in-office visit: 5  Interim History: Adrean states that he has been renovating his bathroom and feels that he has been "burning more calories" doing that. He reports that he has been making good choices. He did not track often. He started using his new CPAP last week and is feeling better.  Subjective:   Vitamin D deficiency. Glendon is on OTC Vitamin D daily. His Vitamin D level is improved and he reports energy is good.   Ref. Range 11/06/2019 09:23  Vitamin D, 25-Hydroxy Latest Ref Range: 30.0 - 100.0 ng/mL 47.8   Other hyperlipidemia. Rayn has hyperlipidemia and has been trying to improve his cholesterol levels with intensive lifestyle modification including a low saturated fat diet, exercise and weight loss. He denies any chest pain, claudication or myalgias. Shawnn is on no medication. He is exercising regularly. He denies family history of cholesterol issues. He reports eating good fats like avocados.   Lab Results  Component Value Date   ALT 22 11/06/2019   AST 18 11/06/2019   ALKPHOS 87 11/06/2019   BILITOT 0.8 11/06/2019   Lab Results  Component Value Date   CHOL 193 11/06/2019   HDL 38 (L) 11/06/2019   LDLCALC 130 (H) 11/06/2019   TRIG 139 11/06/2019   CHOLHDL 5.1 (H) 11/06/2019   Prediabetes. Dream has a diagnosis of prediabetes based on his elevated HgA1c and  was informed this puts him at greater risk of developing diabetes. He continues to work on diet and exercise to decrease his risk of diabetes. He denies nausea or hypoglycemia. Bravery is on no medication. Last A1c improved from 5.7 to 5.6. He was excited to see results.  Lab Results  Component Value Date   HGBA1C 5.6 11/06/2019   Lab Results  Component Value Date   INSULIN 13.7 11/06/2019   INSULIN 8.1 06/11/2019   OSA on CPAP. Kebron received and started wearing his CPAP last week. He states he already sees a difference in his energy.  Assessment/Plan:   Vitamin D deficiency. Low Vitamin D level contributes to fatigue and are associated with obesity, breast, and colon cancer. He agrees to continue to take OTC Vitamin D as directed and will follow-up for routine testing of Vitamin D, at least 2-3 times per year to avoid over-replacement.  Other hyperlipidemia. Cardiovascular risk and specific lipid/LDL goals reviewed.  We discussed several lifestyle modifications today and Chanceler will continue to work on diet, exercise and weight loss efforts. Orders and follow up as documented in patient record.   Counseling Intensive lifestyle modifications are the first line treatment for this issue.  Dietary changes: Increase soluble fiber. Decrease simple carbohydrates.  Exercise changes: Moderate to vigorous-intensity aerobic activity 150 minutes per week if tolerated.  Lipid-lowering medications: see documented in medical record.  Prediabetes. Amadou will continue to work on weight loss, exercise, and decreasing simple carbohydrates to help decrease the risk of  diabetes.   OSA on CPAP. Intensive lifestyle modifications are the first line treatment for this issue. We discussed several lifestyle modifications today and he will continue to work on diet, exercise and weight loss efforts. We will continue to monitor. Orders and follow up as documented in patient record. Dev will continue  with CPAP as directed.   Class 3 severe obesity with serious comorbidity and body mass index (BMI) of 40.0 to 44.9 in adult, unspecified obesity type (HCC).  Keiandre is currently in the action stage of change. As such, his goal is to continue with weight loss efforts. He has agreed to keeping a food journal and adhering to recommended goals of 1800 calories and 115 grams of protein daily.   Exercise goals: For substantial health benefits, adults should do at least 150 minutes (2 hours and 30 minutes) a week of moderate-intensity, or 75 minutes (1 hour and 15 minutes) a week of vigorous-intensity aerobic physical activity, or an equivalent combination of moderate- and vigorous-intensity aerobic activity. Aerobic activity should be performed in episodes of at least 10 minutes, and preferably, it should be spread throughout the week.  Behavioral modification strategies: planning for success and keeping a strict food journal.  Audrey has agreed to follow-up with our clinic in 4 weeks. He was informed of the importance of frequent follow-up visits to maximize his success with intensive lifestyle modifications for his multiple health conditions.   Objective:   Blood pressure 102/70, pulse 67, temperature 97.9 F (36.6 C), height 5\' 11"  (1.803 m), weight 300 lb (136.1 kg), SpO2 97 %. Body mass index is 41.84 kg/m.  General: Cooperative, alert, well developed, in no acute distress. HEENT: Conjunctivae and lids unremarkable. Cardiovascular: Regular rhythm.  Lungs: Normal work of breathing. Neurologic: No focal deficits.   Lab Results  Component Value Date   CREATININE 0.83 11/06/2019   BUN 10 11/06/2019   NA 141 11/06/2019   K 4.8 11/06/2019   CL 104 11/06/2019   CO2 26 11/06/2019   Lab Results  Component Value Date   ALT 22 11/06/2019   AST 18 11/06/2019   ALKPHOS 87 11/06/2019   BILITOT 0.8 11/06/2019   Lab Results  Component Value Date   HGBA1C 5.6 11/06/2019   HGBA1C 5.7 (H)  06/11/2019   Lab Results  Component Value Date   INSULIN 13.7 11/06/2019   INSULIN 8.1 06/11/2019   No results found for: TSH Lab Results  Component Value Date   CHOL 193 11/06/2019   HDL 38 (L) 11/06/2019   LDLCALC 130 (H) 11/06/2019   TRIG 139 11/06/2019   CHOLHDL 5.1 (H) 11/06/2019   Lab Results  Component Value Date   WBC 4.5 06/11/2019   HGB 16.0 06/11/2019   HCT 48.5 06/11/2019   MCV 95 06/11/2019   PLT 252 06/11/2019   Lab Results  Component Value Date   IRON 74 06/11/2019   TIBC 309 06/11/2019   FERRITIN 99 06/11/2019   Attestation Statements:   Reviewed by clinician on day of visit: allergies, medications, problem list, medical history, surgical history, family history, social history, and previous encounter notes.  Time spent on visit including pre-visit chart review and post-visit charting and care was 45 minutes.   I07/12/2019, am acting as transcriptionist for Marianna Payment, PA-C   I have reviewed the above documentation for accuracy and completeness, and I agree with the above. Alois Cliche, PA-C

## 2020-01-06 ENCOUNTER — Ambulatory Visit (INDEPENDENT_AMBULATORY_CARE_PROVIDER_SITE_OTHER): Payer: No Typology Code available for payment source | Admitting: Physician Assistant

## 2020-01-08 ENCOUNTER — Ambulatory Visit (INDEPENDENT_AMBULATORY_CARE_PROVIDER_SITE_OTHER): Payer: No Typology Code available for payment source | Admitting: Physician Assistant

## 2020-01-08 ENCOUNTER — Other Ambulatory Visit: Payer: Self-pay

## 2020-01-08 ENCOUNTER — Encounter (INDEPENDENT_AMBULATORY_CARE_PROVIDER_SITE_OTHER): Payer: Self-pay | Admitting: Physician Assistant

## 2020-01-08 VITALS — BP 99/63 | HR 63 | Temp 97.8°F | Ht 71.0 in | Wt 302.0 lb

## 2020-01-08 DIAGNOSIS — E7849 Other hyperlipidemia: Secondary | ICD-10-CM

## 2020-01-08 DIAGNOSIS — E559 Vitamin D deficiency, unspecified: Secondary | ICD-10-CM

## 2020-01-08 DIAGNOSIS — Z6841 Body Mass Index (BMI) 40.0 and over, adult: Secondary | ICD-10-CM

## 2020-01-08 DIAGNOSIS — R7303 Prediabetes: Secondary | ICD-10-CM

## 2020-01-08 DIAGNOSIS — Z9189 Other specified personal risk factors, not elsewhere classified: Secondary | ICD-10-CM | POA: Diagnosis not present

## 2020-01-08 NOTE — Progress Notes (Signed)
Chief Complaint:   OBESITY Shawn Hawkins is here to discuss his progress with his obesity treatment plan along with follow-up of his obesity related diagnoses. Shawn Hawkins is keeping a food journal and adhering to recommended goals of 1800 calories and 115 grams of protein and states he is following his eating plan approximately 50% of the time. Shawn Hawkins states he is doing cardio and strength training 60 minutes 3 times per week.  Today's visit was #: 8 Starting weight: 316 lbs Starting date: 06/11/2019 Today's weight: 302 lbs Today's date: 01/08/2020 Total lbs lost to date: 14 Total lbs lost since last in-office visit: 0  Interim History: Shawn Hawkins is eating out 8-10 times a week due to his job and busy schedule. He has not been journaling consistently, but states he is making good choices with his food.  Subjective:   Prediabetes. Shawn Hawkins has a diagnosis of prediabetes based on his elevated HgA1c and was informed this puts him at greater risk of developing diabetes. He continues to work on diet and exercise to decrease his risk of diabetes. He denies nausea or hypoglycemia. Last A1c 5.6, improved from 5.7. Shawn Hawkins is on no medication. No polyphagia. He is exercising regularly.  Lab Results  Component Value Date   HGBA1C 5.6 11/06/2019   Lab Results  Component Value Date   INSULIN 13.7 11/06/2019   INSULIN 8.1 06/11/2019   Vitamin D deficiency. Shawn Hawkins is on OTC Vitamin D supplementation. Vitamin D level is improved from his last visit.   Ref. Range 11/06/2019 09:23  Vitamin D, 25-Hydroxy Latest Ref Range: 30.0 - 100.0 ng/mL 47.8   Other hyperlipidemia. Shawn Hawkins has hyperlipidemia and has been trying to improve his cholesterol levels with intensive lifestyle modification including a low saturated fat diet, exercise and weight loss. He denies any chest pain, claudication or myalgias. Shawn Hawkins is on no medication. Last LDL worsening; last HDL decreased despite exercise.  Lab  Results  Component Value Date   ALT 22 11/06/2019   AST 18 11/06/2019   ALKPHOS 87 11/06/2019   BILITOT 0.8 11/06/2019   Lab Results  Component Value Date   CHOL 193 11/06/2019   HDL 38 (L) 11/06/2019   LDLCALC 130 (H) 11/06/2019   TRIG 139 11/06/2019   CHOLHDL 5.1 (H) 11/06/2019   At risk for diabetes mellitus. Shawn Hawkins is at higher than average risk for developing diabetes due to obesity.   Assessment/Plan:   Prediabetes. Shawn Hawkins will continue to work on weight loss, exercise, and decreasing simple carbohydrates to help decrease the risk of diabetes.   Vitamin D deficiency. Low Vitamin D level contributes to fatigue and are associated with obesity, breast, and colon cancer. He agrees to continue to take Vitamin D as directed and will follow-up for routine testing of Vitamin D, at least 2-3 times per year to avoid over-replacement.  Other hyperlipidemia. Cardiovascular risk and specific lipid/LDL goals reviewed.  We discussed several lifestyle modifications today and Shawn Hawkins will continue to work on diet, exercise and weight loss efforts. Orders and follow up as documented in patient record.   Counseling Intensive lifestyle modifications are the first line treatment for this issue. . Dietary changes: Increase soluble fiber. Decrease simple carbohydrates. . Exercise changes: Moderate to vigorous-intensity aerobic activity 150 minutes per week if tolerated. . Lipid-lowering medications: see documented in medical record.  At risk for diabetes mellitus. Shawn Hawkins was given approximately 15 minutes of diabetes education and counseling today. We discussed intensive lifestyle modifications today with an emphasis on weight  loss as well as increasing exercise and decreasing simple carbohydrates in his diet. We also reviewed medication options with an emphasis on risk versus benefit of those discussed.   Repetitive spaced learning was employed today to elicit superior memory formation and  behavioral change.  Class 3 severe obesity with serious comorbidity and body mass index (BMI) of 40.0 to 44.9 in adult, unspecified obesity type (HCC).  Shawn Hawkins is currently in the action stage of change. As such, his goal is to continue with weight loss efforts. He has agreed to keeping a food journal and adhering to recommended goals of 1800 calories and 115 grams of protein daily.   Exercise goals: For substantial health benefits, adults should do at least 150 minutes (2 hours and 30 minutes) a week of moderate-intensity, or 75 minutes (1 hour and 15 minutes) a week of vigorous-intensity aerobic physical activity, or an equivalent combination of moderate- and vigorous-intensity aerobic activity. Aerobic activity should be performed in episodes of at least 10 minutes, and preferably, it should be spread throughout the week.  Behavioral modification strategies: decreasing eating out and keeping a strict food journal.  Shawn Hawkins has agreed to follow-up with our clinic in 4 weeks. He was informed of the importance of frequent follow-up visits to maximize his success with intensive lifestyle modifications for his multiple health conditions.   Objective:   Blood pressure 99/63, pulse 63, temperature 97.8 F (36.6 C), height 5\' 11"  (1.803 m), weight (!) 302 lb (137 kg), SpO2 99 %. Body mass index is 42.12 kg/m.  General: Cooperative, alert, well developed, in no acute distress. HEENT: Conjunctivae and lids unremarkable. Cardiovascular: Regular rhythm.  Lungs: Normal work of breathing. Neurologic: No focal deficits.   Lab Results  Component Value Date   CREATININE 0.83 11/06/2019   BUN 10 11/06/2019   NA 141 11/06/2019   K 4.8 11/06/2019   CL 104 11/06/2019   CO2 26 11/06/2019   Lab Results  Component Value Date   ALT 22 11/06/2019   AST 18 11/06/2019   ALKPHOS 87 11/06/2019   BILITOT 0.8 11/06/2019   Lab Results  Component Value Date   HGBA1C 5.6 11/06/2019   HGBA1C 5.7 (H)  06/11/2019   Lab Results  Component Value Date   INSULIN 13.7 11/06/2019   INSULIN 8.1 06/11/2019   No results found for: TSH Lab Results  Component Value Date   CHOL 193 11/06/2019   HDL 38 (L) 11/06/2019   LDLCALC 130 (H) 11/06/2019   TRIG 139 11/06/2019   CHOLHDL 5.1 (H) 11/06/2019   Lab Results  Component Value Date   WBC 4.5 06/11/2019   HGB 16.0 06/11/2019   HCT 48.5 06/11/2019   MCV 95 06/11/2019   PLT 252 06/11/2019   Lab Results  Component Value Date   IRON 74 06/11/2019   TIBC 309 06/11/2019   FERRITIN 99 06/11/2019   Attestation Statements:   Reviewed by clinician on day of visit: allergies, medications, problem list, medical history, surgical history, family history, social history, and previous encounter notes.  I07/12/2019, am acting as transcriptionist for Marianna Payment, PA-C   I have reviewed the above documentation for accuracy and completeness, and I agree with the above. Alois Cliche, PA-C

## 2020-01-13 NOTE — Procedures (Signed)
    NAME: Shawn Hawkins DATE OF BIRTH:  03/08/1990 MEDICAL RECORD NUMBER [id number]  LOCATION: [facility]  PHYSICIAN: Deretha Emory  DATE OF STUDY: 09/24/2019  SLEEP STUDY TYPE: Out of Center Sleep Test                REFERRING PHYSICIAN: Deretha Emory, MD   EPWORTH SLEEPINESS SCORE:   HEIGHT: 6' (182.9 cm)  WEIGHT: (!) 305 lb (138.3 kg)    Body mass index is 41.37 kg/m.  NECK SIZE: 19 in.  See 09/30/19 note for HSAT that was completed.   Deretha Emory Sleep specialist, [employer] Board of [employer] Medicine  ELECTRONICALLY SIGNED ON:  01/13/2020, 1:43 PM  [facility] PH: [phone]   FX: [phone] ACCREDITED BY THE [employer] SLE[employer]IC[employer]

## 2020-02-05 ENCOUNTER — Ambulatory Visit (INDEPENDENT_AMBULATORY_CARE_PROVIDER_SITE_OTHER): Payer: No Typology Code available for payment source | Admitting: Physician Assistant

## 2021-09-08 ENCOUNTER — Encounter (INDEPENDENT_AMBULATORY_CARE_PROVIDER_SITE_OTHER): Payer: Self-pay

## 2021-10-19 ENCOUNTER — Other Ambulatory Visit (HOSPITAL_COMMUNITY): Payer: Self-pay

## 2021-10-19 MED ORDER — ERYTHROMYCIN 5 MG/GM OP OINT
TOPICAL_OINTMENT | OPHTHALMIC | 0 refills | Status: AC
  Filled 2021-10-19: qty 3.5, 7d supply, fill #0

## 2022-03-08 DIAGNOSIS — J069 Acute upper respiratory infection, unspecified: Secondary | ICD-10-CM | POA: Diagnosis not present

## 2022-03-08 DIAGNOSIS — U071 COVID-19: Secondary | ICD-10-CM | POA: Diagnosis not present

## 2022-03-11 DIAGNOSIS — R0981 Nasal congestion: Secondary | ICD-10-CM | POA: Diagnosis not present

## 2022-03-11 DIAGNOSIS — U071 COVID-19: Secondary | ICD-10-CM | POA: Diagnosis not present

## 2022-06-23 DIAGNOSIS — D485 Neoplasm of uncertain behavior of skin: Secondary | ICD-10-CM | POA: Diagnosis not present

## 2022-06-23 DIAGNOSIS — D224 Melanocytic nevi of scalp and neck: Secondary | ICD-10-CM | POA: Diagnosis not present

## 2022-06-23 DIAGNOSIS — D225 Melanocytic nevi of trunk: Secondary | ICD-10-CM | POA: Diagnosis not present

## 2022-06-23 DIAGNOSIS — D2371 Other benign neoplasm of skin of right lower limb, including hip: Secondary | ICD-10-CM | POA: Diagnosis not present

## 2022-06-23 DIAGNOSIS — D2239 Melanocytic nevi of other parts of face: Secondary | ICD-10-CM | POA: Diagnosis not present

## 2022-06-23 DIAGNOSIS — L813 Cafe au lait spots: Secondary | ICD-10-CM | POA: Diagnosis not present

## 2023-03-23 DIAGNOSIS — R051 Acute cough: Secondary | ICD-10-CM | POA: Diagnosis not present

## 2023-03-23 DIAGNOSIS — J069 Acute upper respiratory infection, unspecified: Secondary | ICD-10-CM | POA: Diagnosis not present

## 2023-03-28 DIAGNOSIS — J019 Acute sinusitis, unspecified: Secondary | ICD-10-CM | POA: Diagnosis not present
